# Patient Record
Sex: Male | Born: 1970 | Race: Black or African American | Hispanic: No | Marital: Single | Smoking: Current every day smoker
Health system: Southern US, Community
[De-identification: ages and names within clinical notes are randomized; demographics above are authoritative.]

## PROBLEM LIST (undated history)

## (undated) DIAGNOSIS — M5030 Other cervical disc degeneration, unspecified cervical region: Secondary | ICD-10-CM

## (undated) DIAGNOSIS — M1990 Unspecified osteoarthritis, unspecified site: Secondary | ICD-10-CM

## (undated) HISTORY — DX: Other cervical disc degeneration, unspecified cervical region: M50.30

## (undated) HISTORY — PX: CERVICAL SPINE SURGERY: SHX589

## (undated) HISTORY — DX: Unspecified osteoarthritis, unspecified site: M19.90

---

## 2008-02-25 ENCOUNTER — Emergency Department (HOSPITAL_COMMUNITY): Admission: EM | Admit: 2008-02-25 | Discharge: 2008-02-25 | Payer: Self-pay | Admitting: Emergency Medicine

## 2008-03-03 ENCOUNTER — Ambulatory Visit (HOSPITAL_COMMUNITY): Admission: RE | Admit: 2008-03-03 | Discharge: 2008-03-03 | Payer: Self-pay | Admitting: Neurosurgery

## 2008-03-14 ENCOUNTER — Inpatient Hospital Stay (HOSPITAL_COMMUNITY): Admission: RE | Admit: 2008-03-14 | Discharge: 2008-03-17 | Payer: Self-pay | Admitting: Neurosurgery

## 2008-03-21 ENCOUNTER — Emergency Department (HOSPITAL_COMMUNITY): Admission: EM | Admit: 2008-03-21 | Discharge: 2008-03-21 | Payer: Self-pay | Admitting: Emergency Medicine

## 2008-03-30 ENCOUNTER — Inpatient Hospital Stay (HOSPITAL_COMMUNITY): Admission: RE | Admit: 2008-03-30 | Discharge: 2008-04-01 | Payer: Self-pay | Admitting: Neurosurgery
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
## 2010-06-16 ENCOUNTER — Emergency Department (HOSPITAL_COMMUNITY): Payer: Self-pay

## 2010-06-16 ENCOUNTER — Emergency Department (HOSPITAL_COMMUNITY)
Admission: EM | Admit: 2010-06-16 | Discharge: 2010-06-16 | Disposition: A | Discharge status: Home / Self Care | Payer: Self-pay | Attending: Emergency Medicine | Admitting: Emergency Medicine

## 2010-06-16 DIAGNOSIS — M79609 Pain in unspecified limb: Secondary | ICD-10-CM | POA: Insufficient documentation

## 2010-06-16 DIAGNOSIS — S6390XA Sprain of unspecified part of unspecified wrist and hand, initial encounter: Secondary | ICD-10-CM | POA: Insufficient documentation

## 2010-06-16 DIAGNOSIS — W1789XA Other fall from one level to another, initial encounter: Secondary | ICD-10-CM | POA: Insufficient documentation

## 2010-09-04 NOTE — Op Note (Signed)
NAMEHAMDI, Mark Preston NO.:  [id number]   MEDICAL RECORD NO.:  [id number]          PATIENT TYPE:  INP   LOCATION:  3027                         FACILITY:  [facility]   PHYSICIAN:  Cristi Loron, M.D.DATE OF BIRTH:  [birth year]   DATE OF PROCEDURE:  03/14/2008  DATE OF DISCHARGE:                               OPERATIVE REPORT   BRIEF HISTORY:  The patient is a 40 year old black male who has suffered  from neck and arm pain with numbness and weakness consistent with a  cervical myelopathy.  He was worked up with a cervical CT and a cervical  MRI which demonstrated that the patient has severe stenosis at C3-4 and  C4-5 with more moderate stenosis at C5-6 and C6-7.  I discussed the  various treatment option with the patient and recommended he consider a  four-level anterior cervical diskectomy with fusion plating and  decompress the spinal cord and nerves.  The patient has weighed the  risks, benefits, and alternatives of surgery, and decided to proceed  with that operation.   PREOPERATIVE DIAGNOSES:  C3-4, C4-5, C5-6, C6-7 degenerative disease,  spondylosis stenosis, cervical myelopathy/radiculopathy, cervicalgia.   POSTOPERATIVE DIAGNOSES:  C3-4, C4-5, C5-6, C6-7 degenerative disease,  spondylosis stenosis, cervical myelopathy/radiculopathy, cervicalgia.   PROCEDURE:  C3-4, C4-5, C5-6, C6-7 extensive anterior cervical  diskectomy/decompression; C3-4, C4-5, C5-6, C6-7 anterior interbody  arthrodesis with local morselized autograft bone and Actifuse bone graft  extender; insertion of C3-4, C4-5, C5-6, C6-7 anterior interbody  prosthesis ([place] PEEK interbody prosthesis); anterior cervical  instrumentation with Medtronic transatrial plate C3 to C7.   SURGEON:  Cristi Loron, MD   ASSISTANT:  Stefani Dama, MD   ANESTHESIA:  General endotracheal.   ESTIMATED BLOOD LOSS:  200 mL.   SPECIMENS:  None.   DRAINS:  One prevertebral Jackson-Pratt drain.   COMPLICATIONS:  None.   DESCRIPTION OF PROCEDURE:  The patient was brought to the operating room  by the anesthesia team.  General endotracheal anesthesia was induced.  The patient was carefully induced with midline traction.  The patient  remained in supine position.  A roll was placed under his shoulders  placing the neck.  His neck has slight extension.  His anterior cervical  region was then prepared with Betadine scrub and Betadine solution.  Sterile drapes were applied and then injected the area to be incised  with Marcaine with epinephrine solution.  A scalpel to make a transverse  incision in the patient's left anterior neck.  I used the Metzenbaum  scissors to divide the platysma muscle and then to dissect medial to  sternocleidomastoid muscle, jugular vein, and carotid artery.  We  carefully dissected down towards the anterior cervical spine identifying  esophagus retracting medially.  We then used the Kittner swabs to clear  soft tissue from the anterior cervical spine and then inserted a bent  spinal needle into one of the upper exposed intervertebral disk spaces.  We obtained intraoperative radiograph to confirm our location and we  then used electrocautery to detach the medial border of the longus colli  muscle bilaterally  from C3-4, C4-5, C5-6, and C6-7 intervertebral disk  spaces.  We inserted the Caspar self-retaining retractor underneath the  longus colli muscle bilaterally to provide exposure.  Then, we began  decompression at C3-4.  We incised the C3-4 intervertebral disk with a  15 blade scalpel.  We performed a partial intervertebral diskectomy with  pituitary forceps and the Carlens curettes.  We then inserted  distraction screws at C3 and C4 and distracted the interspace.  We then  used the high-speed drill to decorticate the vertebral endplates at C3-4  through drill away the remainder of C3-4 intervertebral disk to thin out  the posterior longitudinal ligament  and to drill away some posterior  spondylosis.  We incised the thinned out ligament with arachnoid knife  and removed it with Kerrison punch undercutting the vertebral endplates  and decompressing the thecal sac.  We then performed foraminotomies  about the bilateral C4 nerve root, completing the decompression at this  level.   We then repeated this procedure in an analogous fashion at C4-5, C5-6,  and C6-7, decompressing the thecal sac at these levels as well as  bilateral C5, C6, and C7 nerve roots.  This completed the decompression.   We now turned our attention to the arthrodesis.  We used trial spacers  and determined to use a medium PEEK interbody prosthesis to 6-mm at C6-  7, C5-6, and C4-5, and 7-mm at C3-4.  We prefilled these prosthesis.  With a combination of local autograft bone, we obtained during the  decompression as well as Actifuse bone graft extender.  We then inserted  these prostheses and distracted interspaces and then removed distraction  screws.  There was a good snug fit of the prosthesis at each level.  This completed the fusion.   We now turned our attention to the anterior spinal instrumentation.  We  obtained appropriate-length Medtronic translational plate.  We laid it  along the anterior aspect of the vertebral bodies from C3-C7.  We  drilled two 11-mm holes at C3, C4, C5, C6, C7.  We inserted 3-mm screws  into each one of the holes, we could not get to the screws to go down  all the way, so we removed them and put 12-mm screws at two of the  holes.  We then obtained an intraoperative radiograph, which  demonstrated good position of the upper plate screws and interbody  prosthesis.  We then secured the screws and plate by locking the cam.  This completed the instrumentation.   We then obtained hemostasis using bipolar electrocautery.  We irrigated  the wound out with bacitracin solution, removed the retractors.  We then  inspected the esophagus for any  damage; there was none apparent.  We  then placed a 10 mm flat Jackson-Pratt drain in the prevertebral space.  We tunneled out through separate stab wound.  We then reapproximated the  patient's platysma muscle with interrupted 3-0 Vicryl suture,  subcutaneous tissue with interrupted 3-0 Vicryl suture, and the skin  with Steri-Strips and Benzoin.  The wound was then coated with  bacitracin ointment and sterile dressing was applied.  The drapes were  removed and the patient was subsequently extubated by the anesthesia  team and transported to postanesthesia care unit in stable condition  with normal motor strength in his bilateral deltoid, biceps, triceps,  and lower extremities.      Cristi Loron, M.D.  Electronically Signed     JDJ/MEDQ  D:  03/14/2008  T:  03/15/2008  Job:  [id number]

## 2010-09-04 NOTE — Op Note (Signed)
[name], Mark Preston NO.:  [id number]   MEDICAL RECORD NO.:  [id number]          PATIENT TYPE:  INP   LOCATION:  3533                         FACILITY:  [facility]   PHYSICIAN:  Cristi Loron, M.D.DATE OF BIRTH:  1970-10-25   DATE OF PROCEDURE:  03/30/2008  DATE OF DISCHARGE:                               OPERATIVE REPORT   BRIEF HISTORY:  The patient is a 40 year old black male who has suffered  from cervical myelopathy.  I recommended he undergo two-stage procedure,  i.e. C3-4, C4-5, C5-6, and C6-7 anterior cervical diskectomy fusion  plating to be followed by a second stage posterior cervical  instrumentation and fusion.  The patient weighed the risks, benefits,  and alternatives of surgery and decided to proceed with these  operations.   PREOPERATIVE DIAGNOSES:  C3-4, C4-5, C5-6, and C6-7 spondylosis,  stenosis, cervical myelopathy, radiculopathy, cervicalgia.   POSTOPERATIVE DIAGNOSES:  C3-4, C4-5, C5-6, and C6-7 spondylosis,  stenosis, cervical myelopathy, radiculopathy, cervicalgia.   PROCEDURE:  This is a second-stage procedure as follows.  Posterior C3-  4, C4-5, C5-6, and C6-7 arthrodesis with local morselized autograft bone  and bone morphogenic protein and Actifuse bone graft extender; posterior  cervical instrumentation C3 through C7 with Medtronic polyaxial lateral  mass titanium screws and rods.   SURGEON:  Cristi Loron, MD   ASSISTANT:  Payton Doughty, MD   ANESTHESIA:  General endotracheal.   ESTIMATED BLOOD LOSS:  100 mL.   SPECIMENS:  None.   DRAINS:  None.   COMPLICATIONS:  None.   PROCEDURE:  The patient was brought to the operating room by the  anesthesia team.  General endotracheal anesthesia was induced.  The  patient's calvarium was then supported in the [place] three-point  headrest.  The patient was then carefully turned to the prone position  on the chest rolls.  We confirmed its neutral cervical alignment using  fluoroscopy.  The patient's head was supported in the [place] three-  point headrest.  We then used clippers to shave the patient's  suboccipital region.  This region as well as his posterior neck and  upper thorax was then prepared with Betadine scrub and Betadine  solution.  Sterile drapes were applied.  I then injected the area to be  incised with Marcaine with epinephrine solution.  I then used a scalpel  to make a linear midline incision over the interspaces from C3-4 down to  C7-T1.  I used electrocautery to perform a bilateral subperiosteal  dissection exposing the spinous process, lamina lateral masses from C3-  C7 bilaterally.  We used a cerebellar retractor for exposure, and we  confirmed the location/levels with intraoperative fluoroscopy and then  identified the midportion of the lateral masses from C3-C7 bilaterally.  I used the drill and awl to create an entry point at approximately the  middle, upper, and lateral masses.  We aimed in a cephalad and lateral  direction and also under fluoroscopic guidance, drill 14-mm holes from  C3 to C5 bilaterally.  We then inserted 14-mm screws under fluoroscopic  guidance from C3-C5 bilaterally.  At  C6 and C7 bilaterally, we were  unable to visualize the lateral masses under fluoroscopy, but we used  anatomic landmarks to identify the middle of the lateral masses and  gaining cephalad to lateral direction and we placed 40-mm screws  bilaterally at C6 and C7.  I should mention prior inserting the screws,  we probed inside the drill hole with a ball probe and ruled out cortical  breaches at each level.  We had good purchase of screws at each level.   We then cut an appropriate length rod and went to appropriate lordosis  and then connected the lateral screws with the rod, which we tightened  and placed with the caps, this completed the instrumentation.   We then turned out attention to posterolateral arthrodesis.  We used a  high-speed  drill to decorticate the remainder of the lateral masses and  facets from C3 down to C7 bilaterally.  We then laid bone morphogenic  protein-soaked collagen sponges over these decorticated lateral masses  and then placed Actifuse over this.  This completed the posterolateral  arthrodesis.   We then obtained hemostasis using bipolar electrocautery, removed the  retractors and then reapproximated the patient's cervicothoracic fascia  with interrupted 0-Vicryl suture, subcutaneously with interrupted 2-0  Vicryl suture, and skin with Steri-Strips and Benzoin.  The wound was  then coated with bacitracin ointment, sterile dressing was applied, the  drapes were removed, and the patient was subsequently returned to the  supine position and [place] three-point headrest was removed from its  calvarium and the patient was subsequently extubated by the anesthesia  team and transported to the post anesthesia care unit in stable  condition.  All sponge, instrument, and needle counts were correct at  the end of this case.      Cristi Loron, M.D.  Electronically Signed     JDJ/MEDQ  D:  03/31/2008  T:  03/31/2008  Job:  [id number]

## 2010-09-07 NOTE — Discharge Summary (Signed)
Mark Preston, Mark Preston NO.:  [id number]   MEDICAL RECORD NO.:  [id number]          PATIENT TYPE:  INP   LOCATION:  3027                         FACILITY:  [facility]   PHYSICIAN:  Cristi Loron, M.D.DATE OF BIRTH:  01/02/1971   DATE OF ADMISSION:  03/14/2008  DATE OF DISCHARGE:  03/17/2008                               DISCHARGE SUMMARY   BRIEF HISTORY:  The patient is a 40 year old black male who suffers from  a cervical myelopathy.  He was worked up with cervical CT and cervical  MRI which demonstrated the patient has severe stenosis at C3-C4 and C4-  C5 with more moderate stenosis at C5-C6 and C6-C7.  I discussed the  various treatment options with the patient and recommended he undergo a  four-level anterior cervical diskectomy with fusion plating.  The  patient has weighed the risks, benefits, and alternatives of surgery,  and decided to proceed with that operation.   For further details of this admission, please refer to the patient's  admission history and physical.   HOSPITAL COURSE:  Admitted the patient to [facility] on  March 14, 2008.  On the day of admission, I performed a C3-C4, C4-C5,  C5-C6, and C6-C7 anterior cervical diskectomy with fusion plating.  The  surgery went well (for full details of this operation, please refer to  operative note).   POSTOPERATIVE COURSE:  The patient's postoperative course was  unremarkable and by postoperative day #3, the patient was afebrile.  His  vital signs were stable.  His dysphagia had improved and he was  requesting a discharge to home.   DISCHARGE INSTRUCTIONS:  The patient was given main discharge  instructions and instructions to follow up with me in 4 weeks.   DISCHARGE PRESCRIPTIONS:  1. Percocet 10/325 #100 one p.o. q.4 h. p.r.n. pain.  2. Valium 5 mg #15 one p.o. q.6 h. p.r.n. for muscle spasm.   FINAL DIAGNOSES:  C3-C4, C4-C5, C5-C6, and C6-C7 spondylosis, stenosis,  degenerative  disk disease, cervical myelopathy, cervical radiculopathy,  and cervicalgia.   PROCEDURE PERFORMED:  C3-C4, C4-C5, C5-C6, and C6-C7 extensive anterior  cervical diskectomy/decompression; C3-C4, C4-C5, C5-C6, and C6-C7  anterior interbody arthrodesis with local morselized autograft bone and  Actifuse bone graft extender; insertion of C3-C4, C4-C5, C5-C6, and C6-  C7 anterior interbody prosthesis ([place]eville PEEK interbody prosthesis);  anterior cervical instrumentation and fusion with Medtronic  translational plate C3 to C7.      Cristi Loron, M.D.  Electronically Signed    JDJ/MEDQ  D:  05/05/2008  T:  05/06/2008  Job:  [id number]

## 2011-01-23 LAB — CBC
Platelets: ADEQUATE
RBC: 4.6

## 2011-01-25 LAB — CBC
HCT: 42.2 % (ref 39.0–52.0)
Hemoglobin: 14.7 g/dL (ref 13.0–17.0)
MCHC: 34.8 g/dL (ref 30.0–36.0)
RBC: 4.19 MIL/uL — ABNORMAL LOW (ref 4.22–5.81)
RDW: 12.4 % (ref 11.5–15.5)
WBC: 10 10*3/uL (ref 4.0–10.5)

## 2013-12-10 ENCOUNTER — Encounter (HOSPITAL_COMMUNITY): Payer: Self-pay | Admitting: Emergency Medicine

## 2013-12-10 ENCOUNTER — Emergency Department (HOSPITAL_COMMUNITY)
Admission: EM | Admit: 2013-12-10 | Discharge: 2013-12-10 | Disposition: A | Discharge status: Home / Self Care | Payer: Self-pay | Attending: Emergency Medicine | Admitting: Emergency Medicine

## 2013-12-10 DIAGNOSIS — H53149 Visual discomfort, unspecified: Secondary | ICD-10-CM | POA: Insufficient documentation

## 2013-12-10 DIAGNOSIS — H5710 Ocular pain, unspecified eye: Secondary | ICD-10-CM | POA: Insufficient documentation

## 2013-12-10 DIAGNOSIS — R110 Nausea: Secondary | ICD-10-CM | POA: Insufficient documentation

## 2013-12-10 DIAGNOSIS — H5789 Other specified disorders of eye and adnexa: Secondary | ICD-10-CM | POA: Insufficient documentation

## 2013-12-10 DIAGNOSIS — H579 Unspecified disorder of eye and adnexa: Secondary | ICD-10-CM | POA: Insufficient documentation

## 2013-12-10 DIAGNOSIS — F17200 Nicotine dependence, unspecified, uncomplicated: Secondary | ICD-10-CM | POA: Insufficient documentation

## 2013-12-10 MED ORDER — TETRACAINE HCL 0.5 % OP SOLN
2.0000 [drp] | Freq: Once | OPHTHALMIC | Status: AC
  Administered 2013-12-10: 2 [drp] via OPHTHALMIC
  Filled 2013-12-10: qty 2

## 2013-12-10 MED ORDER — FLUORESCEIN SODIUM 1 MG OP STRP
1.0000 | ORAL_STRIP | Freq: Once | OPHTHALMIC | Status: AC
  Administered 2013-12-10: 1 via OPHTHALMIC
  Filled 2013-12-10: qty 1

## 2013-12-10 MED ORDER — GENTAMICIN SULFATE 0.3 % OP SOLN: 2.0000 [drp] | OPHTHALMIC | Status: DC

## 2013-12-10 MED ORDER — KETOROLAC TROMETHAMINE 0.4 % OP SOLN: 1.0000 [drp] | Freq: Four times a day (QID) | OPHTHALMIC | Status: DC

## 2013-12-10 NOTE — Care Management Note (Signed)
ED/CM noted patient did not have health insurance and/or PCP listed in the computer.  [facility] was given the [facility] resource handout with information on the clinics, food pantries, and the handout for new health insurance sign-up.  Patient expressed appreciation for information received. Pt was given a Rx discount card also.

## 2013-12-10 NOTE — ED Notes (Signed)
Fluorescein strips, tetracaine, and woods lamp set up at bedside, PA performing exam.

## 2013-12-10 NOTE — ED Provider Notes (Signed)
CSN: [id number]     Arrival date & time 12/10/13  1002 History   First MD Initiated Contact with Patient 12/10/13 1021     Chief Complaint  Patient presents with  . Eye Pain     (Consider location/radiation/quality/duration/timing/severity/associated sxs/prior Treatment) HPI Comments: Patient is a 43 year old male who presents to the emergency department today with right eye pain. He reports that on Monday he was walking up a ladder with a bag of bricks. The bag broke and he rubbed his eye On Wednesday he woke up with eye pain and redness. He has watery discharge coming from his eye. He has been using Visine without relief of his symptoms. He has photophobia. He has some associated nausea. He describes that pain as throbbing. No blurry vision, double vision. He has no history of prior eye surgery. No history of glaucoma.  Patient is a 43 y.o. male presenting with eye pain. The history is provided by the patient. No language interpreter was used.  Eye Pain Associated symptoms include nausea. Pertinent negatives include no chest pain, chills, fever or vomiting.    History reviewed. No pertinent past medical history. Past Surgical History  Procedure Laterality Date  . Cervical spine surgery     No family history on file. History  Substance Use Topics  . Smoking status: Current Every Day Smoker    Types: Cigarettes  . Smokeless tobacco: Not on file  . Alcohol Use: Yes     Comment: occ    Review of Systems  Constitutional: Negative for fever and chills.  Eyes: Positive for photophobia, pain, discharge and redness. Negative for itching and visual disturbance.  Respiratory: Negative for shortness of breath.   Cardiovascular: Negative for chest pain.  Gastrointestinal: Positive for nausea. Negative for vomiting.  All other systems reviewed and are negative.     Allergies  Review of patient's allergies indicates no known allergies.  Home Medications   Prior to Admission  medications   Not on File   BP 148/87  Pulse 94  Temp(Src) 98.1 F (36.7 C) (Oral)  Resp 16  Ht 5\' 10"  (1.778 m)  Wt 205 lb (92.987 kg)  BMI 29.41 kg/m2  SpO2 97% Physical Exam  Nursing note and vitals reviewed. Constitutional: He is oriented to person, place, and time. He appears well-developed and well-nourished. No distress.  HENT:  Head: Normocephalic and atraumatic.  Right Ear: External ear normal.  Left Ear: External ear normal.  Nose: Nose normal.  Eyes: EOM and lids are normal. Pupils are equal, round, and reactive to light. Lids are everted and swept, no foreign bodies found. Right eye exhibits no chemosis, no discharge, no exudate and no hordeolum. No foreign body present in the right eye. Right conjunctiva is injected. Right eye exhibits normal extraocular motion and no nystagmus.  Slit lamp exam:      The right eye shows no corneal abrasion, no corneal flare, no corneal ulcer, no foreign body, no hyphema, no hypopyon and no fluorescein uptake.  IOP of right eye is 23 Right conjunctiva is red and irritated. No fluorescein dye uptake.  No consensual photophobia.   Neck: Normal range of motion. No tracheal deviation present.  Cardiovascular: Normal rate, regular rhythm and normal heart sounds.   Pulmonary/Chest: Effort normal and breath sounds normal. No stridor.  Abdominal: Soft. He exhibits no distension. There is no tenderness.  Musculoskeletal: Normal range of motion.  Neurological: He is alert and oriented to person, place, and time.  Skin: Skin is  warm and dry. He is not diaphoretic.  Psychiatric: He has a normal mood and affect. His behavior is normal.    ED Course  Procedures (including critical care time) Labs Review Labs Reviewed - No data to display  Imaging Review No results found.   EKG Interpretation None      MDM   Final diagnoses:  Eye irritation    Patient presents to ED with right eye irritation. No visual disturbance. IOP 23. No  corneal abrasion or fluorescein dye uptake. Discussed this case with Dr. Lita MainsHaines who recommends abx drops and Ketoralac drops. This is likely not an alkaline burn given time frame and materials used. He will see patient in the office on Monday. Discussed with patient reason to return to ED immediately. Vital signs stable for discharge. Dr. Estell HarpinZammit evaluated patient and agrees with plan. Patient / Family / Caregiver informed of clinical course, understand medical decision-making process, and agree with plan.  Mora BellmanHannah S Wyona Neils, PA-C 12/10/13 [phone]

## 2013-12-10 NOTE — ED Notes (Signed)
Redness, watery right eye x 2 days.  Reports light sensitivity.  States feels like he small particle rocks fell in eye while at work.

## 2013-12-10 NOTE — ED Notes (Signed)
Tono pen given to PA for examination.

## 2013-12-10 NOTE — Discharge Instructions (Signed)
Please use the prescriptions you are receiving today. Follow up with Dr. Lita MainsHaines on Monday. Return to the Emergency Room for new or worsening symptoms. We did not find any scratches on your eye.

## 2013-12-13 NOTE — ED Provider Notes (Signed)
Medical screening examination/treatment/procedure(s) were performed by non-physician practitioner and as supervising physician I was immediately available for consultation/collaboration.   EKG Interpretation None        Yovanni Frenette L Izekiel Flegel, MD 12/13/13 1701 

## 2014-10-18 ENCOUNTER — Emergency Department (HOSPITAL_COMMUNITY)
Admission: EM | Admit: 2014-10-18 | Discharge: 2014-10-18 | Disposition: A | Discharge status: Home / Self Care | Payer: Self-pay | Attending: Emergency Medicine | Admitting: Emergency Medicine

## 2014-10-18 ENCOUNTER — Encounter (HOSPITAL_COMMUNITY): Payer: Self-pay | Admitting: *Deleted

## 2014-10-18 DIAGNOSIS — Z791 Long term (current) use of non-steroidal anti-inflammatories (NSAID): Secondary | ICD-10-CM | POA: Insufficient documentation

## 2014-10-18 DIAGNOSIS — Z4802 Encounter for removal of sutures: Secondary | ICD-10-CM | POA: Insufficient documentation

## 2014-10-18 DIAGNOSIS — Z720 Tobacco use: Secondary | ICD-10-CM | POA: Insufficient documentation

## 2014-10-18 MED ORDER — BACITRACIN-NEOMYCIN-POLYMYXIN 400-5-5000 EX OINT
TOPICAL_OINTMENT | Freq: Once | CUTANEOUS | Status: AC
  Administered 2014-10-18: 1 via TOPICAL
  Filled 2014-10-18: qty 1

## 2014-10-18 NOTE — ED Notes (Signed)
Pt here for suture removal

## 2014-10-18 NOTE — ED Provider Notes (Signed)
CSN: [id number]     Arrival date & time 10/18/14  1956 History   First MD Initiated Contact with Patient 10/18/14 2013     Chief Complaint  Patient presents with  . Suture / Staple Removal     (Consider location/radiation/quality/duration/timing/severity/associated sxs/prior Treatment) HPI Mark Preston is a 44 y.o. male who presents to the ED for suture removal. He denies any problems.  History reviewed. No pertinent past medical history. Past Surgical History  Procedure Laterality Date  . Cervical spine surgery     History reviewed. No pertinent family history. History  Substance Use Topics  . Smoking status: Current Every Day Smoker    Types: Cigarettes  . Smokeless tobacco: Not on file  . Alcohol Use: Yes     Comment: occ    Review of Systems Negative except as stated in HPI   Allergies  Review of patient's allergies indicates no known allergies.  Home Medications   Prior to Admission medications   Medication Sig Start Date End Date Taking? Authorizing Provider  gentamicin (GARAMYCIN) 0.3 % ophthalmic solution Place 2 drops into the left eye every 4 (four) hours. 12/10/13   Junious SilkHannah Merrell, PA-C  ketorolac (ACULAR) 0.4 % SOLN Place 1 drop into the left eye 4 (four) times daily. 12/10/13   Junious SilkHannah Merrell, PA-C   BP 127/80 mmHg  Pulse 79  Temp(Src) 98.2 F (36.8 C) (Oral)  Resp 15  Ht 5\' 10"  (1.778 m)  Wt 210 lb (95.255 kg)  BMI 30.13 kg/m2  SpO2 99% Physical Exam  Constitutional: He is oriented to person, place, and time. He appears well-developed and well-nourished.  HENT:  Head: Normocephalic.  Eyes: EOM are normal.  Neck: Neck supple.  Cardiovascular: Normal rate.   Pulmonary/Chest: Effort normal.  Musculoskeletal: Normal range of motion.  Sutures in place dorsum of the right hand without signs of infection.   Neurological: He is alert and oriented to person, place, and time. No cranial nerve deficit.  Skin: Skin is warm and dry.  Psychiatric: He has  a normal mood and affect. His behavior is normal.  Nursing note and vitals reviewed.   ED Course  Procedures (including critical care time) Labs Review Sutures removed by RN without difficulty Bacitracin ointment and dressing   MDM  44 y.o. male here for suture removal without problems. Discussed care of wound and all questions answered. He will return as needed for any problems.   Final diagnoses:  Visit for suture removal       Janne NapoleonHope M Mosie Angus, NP 10/18/14 2022  Zadie Rhineonald Wickline, MD 10/19/14 [phone]

## 2014-10-18 NOTE — Discharge Instructions (Signed)

## 2017-05-23 ENCOUNTER — Ambulatory Visit: Payer: Self-pay | Admitting: Family Medicine

## 2022-11-01 ENCOUNTER — Telehealth: Payer: Self-pay

## 2022-11-01 NOTE — Telephone Encounter (Signed)
Returned call to [facility] client who is transitioning to [place]. He states he does not have his card yet and wants to know if he needs to give [place] that number. Explained to him that we do not need his ID number, [facility] and explained that if he chooses he can remain there and recommended to him to call them and let them know that he been approved for Medicaid and they should be able to check his status. He states understanding.    Discussed to please call us again if he has any other questions.    Francee Nodal RN [employer]

## 2022-11-29 ENCOUNTER — Telehealth: Payer: Self-pay

## 2022-11-29 NOTE — Telephone Encounter (Signed)
Attempted follow up/Wellness call to [facility] client recently transitioning to Medicaid. He is with [facility] and next appointment is 01/20/23.  No answer today, left message.  Francee Nodal RN [employer]

## 2022-12-30 DIAGNOSIS — B029 Zoster without complications: Secondary | ICD-10-CM | POA: Diagnosis not present

## 2023-01-01 ENCOUNTER — Encounter: Payer: Self-pay | Admitting: *Deleted

## 2023-01-08 ENCOUNTER — Encounter: Payer: Self-pay | Admitting: Orthopedic Surgery

## 2023-01-08 ENCOUNTER — Other Ambulatory Visit (INDEPENDENT_AMBULATORY_CARE_PROVIDER_SITE_OTHER): Payer: 59

## 2023-01-08 ENCOUNTER — Ambulatory Visit: Payer: 59 | Admitting: Orthopedic Surgery

## 2023-01-08 VITALS — BP 130/79 | HR 73 | Ht 70.0 in | Wt 234.0 lb

## 2023-01-08 DIAGNOSIS — G8929 Other chronic pain: Secondary | ICD-10-CM | POA: Diagnosis not present

## 2023-01-08 DIAGNOSIS — M25562 Pain in left knee: Secondary | ICD-10-CM | POA: Diagnosis not present

## 2023-01-08 MED ORDER — METHYLPREDNISOLONE ACETATE 40 MG/ML IJ SUSP
40.0000 mg | Freq: Once | INTRAMUSCULAR | Status: AC
Start: 2023-01-08 — End: 2023-01-08
  Administered 2023-01-08: 40 mg via INTRA_ARTICULAR

## 2023-01-08 NOTE — Progress Notes (Signed)
New Patient Visit  Assessment: Mark Preston is a 52 y.o. male with the following: 1. Chronic pain of left knee  Plan: MARKIS HOWERY has pain in his left knee.  This has been ongoing for several years.  No specific injury.  He notes periodic swelling.  Pain gets worse the more he is on his feet.  He has pain in the medial knee, as well as the posterior knee.  He has recently started taking meloxicam, which helps with the swelling.  No physical therapy.  I am recommending a steroid injection, and he elected to proceed.  He can return for repeat evaluation, or consideration for an injection in his right knee.  Follow-up as needed.   Procedure note injection Left knee joint   Verbal consent was obtained to inject the left knee joint  Timeout was completed to confirm the site of injection.  The skin was prepped with alcohol and ethyl chloride was sprayed at the injection site.  A 21-gauge needle was used to inject 40 mg of Depo-Medrol and 1% lidocaine (4 cc) into the left knee using an anterolateral approach.  There were no complications. A sterile bandage was applied.     Follow-up: Return if symptoms worsen or fail to improve.  Subjective:  Chief Complaint  Patient presents with   Knee Pain    Bilat knee pain L > R for past 2 yrs getting worse    History of Present Illness: Mark Preston is a 52 y.o. male who has been referred by Coral Ceo, FNP for evaluation of left knee pain.  He is complaining of pain in both knees, but left is worse than right.  This has been ongoing for couple of years.  No specific injury.  He notes periodic swelling.  He has recently started taking meloxicam, which is helping with his symptoms, especially swelling.  No prior injuries.  He has not had an injection.  Has not worked with physical therapy.  He is localizing the pain to the medial aspect of the left knee, as well as the posterior left knee.   Review of Systems: No fevers or chills No  numbness or tingling No chest pain No shortness of breath No bowel or bladder dysfunction No GI distress No headaches   Medical History:  History reviewed. No pertinent past medical history.  Past Surgical History:  Procedure Laterality Date   CERVICAL SPINE SURGERY      History reviewed. No pertinent family history. Social History   Tobacco Use   Smoking status: Every Day    Types: Cigarettes  Substance Use Topics   Alcohol use: Yes    Comment: occ   Drug use: No    No Known Allergies  Current Meds  Medication Sig   meloxicam (MOBIC) 7.5 MG tablet Take 7.5 mg by mouth daily.    Objective: BP 130/79   Pulse 73   Ht 5\' 10"  (1.778 m)   Wt 234 lb (106.1 kg)   BMI 33.58 kg/m   Physical Exam:  General: Alert and oriented. and No acute distress. Gait: Left sided antalgic gait.  Left knee without swelling.  No deformity.  Tenderness to palpation along the medial joint line.  No increased laxity varus or valgus stress.  Negative Lachman.  Tenderness to palpation within the popliteal fossa.  Right knee without swelling.  No deformity.  Minimal tenderness to palpation.  No tenderness within the posterior knee.  Negative Lachman.  No increased laxity to  varus or valgus stress.  IMAGING: I personally ordered and reviewed the following images  X-rays of the left knee were obtained in clinic today.  No acute injuries are noted.  Neutral overall alignment.  Mild loss of joint space within the medial compartment.  Well-maintained joint space throughout the knee.  Small osteophytes are appreciated within the patellofemoral compartment.  No bony lesions.  Impression: Left knee x-rays without acute injury, mild degenerative changes   New Medications:  Meds ordered this encounter  Medications   methylPREDNISolone acetate (DEPO-MEDROL) injection 40 mg      Oliver Barre, MD  01/08/2023 3:09 PM

## 2023-01-08 NOTE — Patient Instructions (Signed)
Instructions Following Joint Injections  In clinic today, you received an injection in one of your joints (sometimes more than one).  Occasionally, you can have some pain at the injection site, this is normal.  You can place ice at the injection site, or take over-the-counter medications such as Tylenol (acetaminophen) or Advil (ibuprofen).  Please follow all directions listed on the bottle.  If your joint (knee or shoulder) becomes swollen, red or very painful, please contact the clinic for additional assistance.   Two medications were injected, including lidocaine and a steroid (often referred to as cortisone).  Lidocaine is effective almost immediately but wears off quickly.  However, the steroid can take a few days to improve your symptoms.  In some cases, it can make your pain worse for a couple of days.  Do not be concerned if this happens as it is common.  You can apply ice or take some over-the-counter medications as needed.   Injections in the same joint cannot be repeated for 3 months.  This helps to limit the risk of an infection in the joint.  If you were to develop an infection in your joint, the best treatment option would be surgery.      Knee Exercises  Ask your health care provider which exercises are safe for you. Do exercises exactly as told by your health care provider and adjust them as directed. It is normal to feel mild stretching, pulling, tightness, or discomfort as you do these exercises. Stop right away if you feel sudden pain or your pain gets worse. Do not begin these exercises until told by your health care provider.  Stretching and range-of-motion exercises These exercises warm up your muscles and joints and improve the movement and flexibility of your knee. These exercises also help to relieve pain and swelling.  Knee extension, prone Lie on your abdomen (prone position) on a bed. Place your left / right knee just beyond the edge of the surface so your knee is  not on the bed. You can put a towel under your left / right thigh just above your kneecap for comfort. Relax your leg muscles and allow gravity to straighten your knee (extension). You should feel a stretch behind your left / right knee. Hold this position for 10 seconds. Scoot up so your knee is supported between repetitions. Repeat 10 times. Complete this exercise 3-4 times per week.     Knee flexion, active Lie on your back with both legs straight. If this causes back discomfort, bend your left / right knee so your foot is flat on the floor. Slowly slide your left / right heel back toward your buttocks. Stop when you feel a gentle stretch in the front of your knee or thigh (flexion). Hold this position for 10 seconds. Slowly slide your left / right heel back to the starting position. Repeat 10 times. Complete this exercise 3-4 times per week.      Quadriceps stretch, prone Lie on your abdomen on a firm surface, such as a bed or padded floor. Bend your left / right knee and hold your ankle. If you cannot reach your ankle or pant leg, loop a belt around your foot and grab the belt instead. Gently pull your heel toward your buttocks. Your knee should not slide out to the side. You should feel a stretch in the front of your thigh and knee (quadriceps). Hold this position for 10 seconds. Repeat 10 times. Complete this exercise 3-4 times per week.  Hamstring, supine Lie on your back (supine position). Loop a belt or towel over the ball of your left / right foot. The ball of your foot is on the walking surface, right under your toes. Straighten your left / right knee and slowly pull on the belt to raise your leg until you feel a gentle stretch behind your knee (hamstring). Do not let your knee bend while you do this. Keep your other leg flat on the floor. Hold this position for 10 seconds. Repeat 10 times. Complete this exercise 3-4 times per week.   Strengthening exercises These  exercises build strength and endurance in your knee. Endurance is the ability to use your muscles for a long time, even after they get tired.  Quadriceps, isometric This exercise stretches the muscles in front of your thigh (quadriceps) without moving your knee joint (isometric). Lie on your back with your left / right leg extended and your other knee bent. Put a rolled towel or small pillow under your knee if told by your health care provider. Slowly tense the muscles in the front of your left / right thigh. You should see your kneecap slide up toward your hip or see increased dimpling just above the knee. This motion will push the back of the knee toward the floor. For 10 seconds, hold the muscle as tight as you can without increasing your pain. Relax the muscles slowly and completely. Repeat 10 times. Complete this exercise 3-4 times per week. .     Straight leg raises This exercise stretches the muscles in front of your thigh (quadriceps) and the muscles that move your hips (hip flexors). Lie on your back with your left / right leg extended and your other knee bent. Tense the muscles in the front of your left / right thigh. You should see your kneecap slide up or see increased dimpling just above the knee. Your thigh may even shake a bit. Keep these muscles tight as you raise your leg 4-6 inches (10-15 cm) off the floor. Do not let your knee bend. Hold this position for 10 seconds. Keep these muscles tense as you lower your leg. Relax your muscles slowly and completely after each repetition. Repeat 10 times. Complete this exercise 3-4 times per week.  Hamstring, isometric Lie on your back on a firm surface. Bend your left / right knee about 30 degrees. Dig your left / right heel into the surface as if you are trying to pull it toward your buttocks. Tighten the muscles in the back of your thighs (hamstring) to "dig" as hard as you can without increasing any pain. Hold this position for 10  seconds. Release the tension gradually and allow your muscles to relax completely for __________ seconds after each repetition. Repeat 10 times. Complete this exercise 3-4 times per week.  Hamstring curls If told by your health care provider, do this exercise while wearing ankle weights. Begin with 5 lb weights. Then increase the weight by 1 lb (0.5 kg) increments. You can also use an exercise band Lie on your abdomen with your legs straight. Bend your left / right knee as far as you can without feeling pain. Keep your hips flat against the floor. Hold this position for 10 seconds. Slowly lower your leg to the starting position. Repeat 10 times. Complete this exercise 3-4 times per week.      Squats This exercise strengthens the muscles in front of your thigh and knee (quadriceps). Stand in front of a table,  with your feet and knees pointing straight ahead. You may rest your hands on the table for balance but not for support. Slowly bend your knees and lower your hips like you are going to sit in a chair. Keep your weight over your heels, not over your toes. Keep your lower legs upright so they are parallel with the table legs. Do not let your hips go lower than your knees. Do not bend lower than told by your health care provider. If your knee pain increases, do not bend as low. Hold the squat position for 10 seconds. Slowly push with your legs to return to standing. Do not use your hands to pull yourself to standing. Repeat 10 times. Complete this exercise 3-4 times per week .     Wall slides This exercise strengthens the muscles in front of your thigh and knee (quadriceps). Lean your back against a smooth wall or door, and walk your feet out 18-24 inches (46-61 cm) from it. Place your feet hip-width apart. Slowly slide down the wall or door until your knees bend 90 degrees. Keep your knees over your heels, not over your toes. Keep your knees in line with your hips. Hold this  position for 10 seconds. Repeat 10 times. Complete this exercise 3-4 times per week.      Straight leg raises This exercise strengthens the muscles that rotate the leg at the hip and move it away from your body (hip abductors). Lie on your side with your left / right leg in the top position. Lie so your head, shoulder, knee, and hip line up. You may bend your bottom knee to help you keep your balance. Roll your hips slightly forward so your hips are stacked directly over each other and your left / right knee is facing forward. Leading with your heel, lift your top leg 4-6 inches (10-15 cm). You should feel the muscles in your outer hip lifting. Do not let your foot drift forward. Do not let your knee roll toward the ceiling. Hold this position for 10 seconds. Slowly return your leg to the starting position. Let your muscles relax completely after each repetition. Repeat 10 times. Complete this exercise 3-4 times per week.      Straight leg raises This exercise stretches the muscles that move your hips away from the front of the pelvis (hip extensors). Lie on your abdomen on a firm surface. You can put a pillow under your hips if that is more comfortable. Tense the muscles in your buttocks and lift your left / right leg about 4-6 inches (10-15 cm). Keep your knee straight as you lift your leg. Hold this position for 10 seconds. Slowly lower your leg to the starting position. Let your leg relax completely after each repetition. Repeat 10 times. Complete this exercise 3-4 times per week.

## 2023-04-21 NOTE — H&P (View-Only) (Signed)
Referring Provider: Chancy Milroy, MD  Primary Care Physician:  Wilmon Pali, FNP Primary Gastroenterologist:  Dr. Jena Gauss  Chief Complaint  Patient presents with   Positive fit test     Positive fit test. Has seen blood in stool before.     HPI:   Mark Preston is a 52 y.o. male presenting today at the request of Chancy Milroy, MD for positive FIT completed 04/01/23.   Today:  Prolapsing hemorrhoids a couple times a year for the last 10 years or so. Will have blood in the stool a couple times with this. Will use preparation H and reduce hemorrhoids and symptoms resolve. Last flare was a couple months ago.   No constipation, diarrhea, abdominal pain, unintentional weight loss. Reflux if eating spaghetti or fried hamburger, but nothing routine. No dysphagia, nausea, or vomiting.   Prior colonoscopy: No.  HFx colon cancer: Cousin.   Has arthritis and several disc surgeries on his neck.  Has been on meloxicam for about 2 months, taking twice a day.  Past Medical History:  Diagnosis Date   Arthritis    Degenerative disc disease, cervical     Past Surgical History:  Procedure Laterality Date   CERVICAL SPINE SURGERY      Current Outpatient Medications  Medication Sig Dispense Refill   gabapentin (NEURONTIN) 300 MG capsule Take 300 mg by mouth 3 (three) times daily.     meloxicam (MOBIC) 7.5 MG tablet Take 7.5 mg by mouth 2 (two) times daily.     No current facility-administered medications for this visit.    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 CV: Denies chest pain, heart palpitations.  Resp: Denies shortness of breath, cough. GI: See HPI GU : Denies urinary burning, urinary frequency, urinary hesitancy MS: Admits to chronic left knee pain. Derm: Denies rash. Psych: Denies depression, anxiety. Heme: See HPI  Physical Exam: BP 122/80 (BP Location: Right Arm, Patient Position: Sitting, Cuff Size: Normal)   Pulse 81   Temp 98.2 F (36.8 C) (Temporal)   Ht 5\' 10"  (1.778 m)   Wt 232 lb (105.2 kg)   BMI 33.29 kg/m  General:   Alert and oriented. Pleasant and cooperative. Well-nourished and well-developed.  Head:  Normocephalic and atraumatic. Eyes:  Without icterus, sclera clear and conjunctiva pink.  Ears:  Normal auditory acuity. Lungs:  Clear to auscultation bilaterally. No wheezes, rales, or rhonchi. No distress.  Heart:  S1, S2 present without murmurs appreciated.  Abdomen:  +BS, soft, non-tender and non-distended. No HSM noted. No guarding or rebound. No masses appreciated.  Rectal:  Deferred  Msk:  Symmetrical without gross deformities. Normal posture. Extremities:  Without edema. Neurologic:  Alert and  oriented x4;  grossly normal neurologically. Skin:  Intact without significant lesions or rashes. Psych:  Normal mood and affect.    Assessment:  52 year old male with history of arthritis,  degenerative disc disease, presenting today at the request of Chancy Milroy, MD for positive FIT completed 04/01/23.  Patient reports to chronic history of intermittent low-volume rectal bleeding in the setting of prolapsing hemorrhoids that occurs a couple times a year with last occurrence about 2 months ago. Hemorrhoids respond well to preparation H. Otherwise, no significant GI symptoms.  No prior colonoscopy.  Cousin with history of colon cancer.  Etiology of positive FIT is not clear, but very well may be related to hemorrhoids.  However, we will proceed with a colonoscopy to rule out colon polyps or malignancy.  Daily NSAID use: Currently taking meloxicam twice daily for arthritis.  Recommended discussing daily acid suppression with PCP to prevent gastric ulceration if meloxicam is going to be a chronic, daily medication for him.   Plan:  Proceed with colonoscopy with propofol by Dr. Jena Gauss in near future. The risks, benefits, and alternatives have been discussed with the patient in detail. The patient states understanding and desires to proceed.  ASA 2 Recommended discussing daily acid suppression with PCP to prevent gastric ulceration if meloxicam is going to be a chronic, daily medication for him. Follow-up PRN.    Ermalinda Memos, PA-C [facility] 04/24/2023

## 2023-04-21 NOTE — Progress Notes (Signed)
 Referring Provider: Chancy Milroy, MD  Primary Care Physician:  Wilmon Pali, FNP Primary Gastroenterologist:  Dr. Jena Gauss  Chief Complaint  Patient presents with   Positive fit test     Positive fit test. Has seen blood in stool before.     HPI:   Mark Preston is a 52 y.o. male presenting today at the request of Chancy Milroy, MD for positive FIT completed 04/01/23.   Today:  Prolapsing hemorrhoids a couple times a year for the last 10 years or so. Will have blood in the stool a couple times with this. Will use preparation H and reduce hemorrhoids and symptoms resolve. Last flare was a couple months ago.   No constipation, diarrhea, abdominal pain, unintentional weight loss. Reflux if eating spaghetti or fried hamburger, but nothing routine. No dysphagia, nausea, or vomiting.   Prior colonoscopy: No.  HFx colon cancer: Cousin.   Has arthritis and several disc surgeries on his neck.  Has been on meloxicam for about 2 months, taking twice a day.  Past Medical History:  Diagnosis Date   Arthritis    Degenerative disc disease, cervical     Past Surgical History:  Procedure Laterality Date   CERVICAL SPINE SURGERY      Current Outpatient Medications  Medication Sig Dispense Refill   gabapentin (NEURONTIN) 300 MG capsule Take 300 mg by mouth 3 (three) times daily.     meloxicam (MOBIC) 7.5 MG tablet Take 7.5 mg by mouth 2 (two) times daily.     No current facility-administered medications for this visit.    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 CV: Denies chest pain, heart palpitations.  Resp: Denies shortness of breath, cough. GI: See HPI GU : Denies urinary burning, urinary frequency, urinary hesitancy MS: Admits to chronic left knee pain. Derm: Denies rash. Psych: Denies depression, anxiety. Heme: See HPI  Physical Exam: BP 122/80 (BP Location: Right Arm, Patient Position: Sitting, Cuff Size: Normal)   Pulse 81   Temp 98.2 F (36.8 C) (Temporal)   Ht 5\' 10"  (1.778 m)   Wt 232 lb (105.2 kg)   BMI 33.29 kg/m  General:   Alert and oriented. Pleasant and cooperative. Well-nourished and well-developed.  Head:  Normocephalic and atraumatic. Eyes:  Without icterus, sclera clear and conjunctiva pink.  Ears:  Normal auditory acuity. Lungs:  Clear to auscultation bilaterally. No wheezes, rales, or rhonchi. No distress.  Heart:  S1, S2 present without murmurs appreciated.  Abdomen:  +BS, soft, non-tender and non-distended. No HSM noted. No guarding or rebound. No masses appreciated.  Rectal:  Deferred  Msk:  Symmetrical without gross deformities. Normal posture. Extremities:  Without edema. Neurologic:  Alert and  oriented x4;  grossly normal neurologically. Skin:  Intact without significant lesions or rashes. Psych:  Normal mood and affect.    Assessment:  52 year old male with history of arthritis,  degenerative disc disease, presenting today at the request of Chancy Milroy, MD for positive FIT completed 04/01/23.  Patient reports to chronic history of intermittent low-volume rectal bleeding in the setting of prolapsing hemorrhoids that occurs a couple times a year with last occurrence about 2 months ago. Hemorrhoids respond well to preparation H. Otherwise, no significant GI symptoms.  No prior colonoscopy.  Cousin with history of colon cancer.  Etiology of positive FIT is not clear, but very well may be related to hemorrhoids.  However, we will proceed with a colonoscopy to rule out colon polyps or malignancy.  Daily NSAID use: Currently taking meloxicam twice daily for arthritis.  Recommended discussing daily acid suppression with PCP to prevent gastric ulceration if meloxicam is going to be a chronic, daily medication for him.   Plan:  Proceed with colonoscopy with propofol by Dr. Jena Gauss in near future. The risks, benefits, and alternatives have been discussed with the patient in detail. The patient states understanding and desires to proceed.  ASA 2 Recommended discussing daily acid suppression with PCP to prevent gastric ulceration if meloxicam is going to be a chronic, daily medication for him. Follow-up PRN.    Ermalinda Memos, PA-C [facility] 04/24/2023

## 2023-04-24 ENCOUNTER — Encounter: Payer: Self-pay | Admitting: *Deleted

## 2023-04-24 ENCOUNTER — Encounter: Payer: Self-pay | Admitting: Gastroenterology

## 2023-04-24 ENCOUNTER — Other Ambulatory Visit: Payer: Self-pay | Admitting: *Deleted

## 2023-04-24 ENCOUNTER — Ambulatory Visit (INDEPENDENT_AMBULATORY_CARE_PROVIDER_SITE_OTHER): Payer: 59 | Admitting: Gastroenterology

## 2023-04-24 VITALS — BP 122/80 | HR 81 | Temp 98.2°F | Ht 70.0 in | Wt 232.0 lb

## 2023-04-24 DIAGNOSIS — Z800 Family history of malignant neoplasm of digestive organs: Secondary | ICD-10-CM | POA: Diagnosis not present

## 2023-04-24 DIAGNOSIS — Z8719 Personal history of other diseases of the digestive system: Secondary | ICD-10-CM

## 2023-04-24 DIAGNOSIS — R195 Other fecal abnormalities: Secondary | ICD-10-CM | POA: Diagnosis not present

## 2023-04-24 DIAGNOSIS — K649 Unspecified hemorrhoids: Secondary | ICD-10-CM

## 2023-04-24 MED ORDER — PEG 3350-KCL-NA BICARB-NACL 420 G PO SOLR: 4000.0000 mL | Freq: Once | ORAL | 0 refills | Status: AC

## 2023-04-24 NOTE — Patient Instructions (Signed)
 We will get you scheduled for a colonoscopy in the near future with Dr. Shaaron.  As we discussed, meloxicam works well for arthritis type pain but can cause inflammation/ulceration in your GI tract.  I would recommend that you limit meloxicam is much as possible.  If you are requiring meloxicam every day or even several days a week, it may be worthwhile to start you on something to keep the acid suppressant in your stomach in order to protect you from developing ulcers.  You can discuss this with your PCP if you end up being on meloxicam long-term.  I will plan to see you back in the office as needed.  It was very nice to meet you today!  Josette Centers, PA-C [facility]

## 2023-05-05 DIAGNOSIS — Z79891 Long term (current) use of opiate analgesic: Secondary | ICD-10-CM | POA: Diagnosis not present

## 2023-05-05 DIAGNOSIS — M170 Bilateral primary osteoarthritis of knee: Secondary | ICD-10-CM | POA: Diagnosis not present

## 2023-05-05 DIAGNOSIS — M542 Cervicalgia: Secondary | ICD-10-CM | POA: Diagnosis not present

## 2023-05-05 DIAGNOSIS — G894 Chronic pain syndrome: Secondary | ICD-10-CM | POA: Diagnosis not present

## 2023-05-05 DIAGNOSIS — M25562 Pain in left knee: Secondary | ICD-10-CM | POA: Diagnosis not present

## 2023-05-05 DIAGNOSIS — M4322 Fusion of spine, cervical region: Secondary | ICD-10-CM | POA: Diagnosis not present

## 2023-05-05 DIAGNOSIS — M25561 Pain in right knee: Secondary | ICD-10-CM | POA: Diagnosis not present

## 2023-05-05 DIAGNOSIS — Z79899 Other long term (current) drug therapy: Secondary | ICD-10-CM | POA: Diagnosis not present

## 2023-05-15 ENCOUNTER — Ambulatory Visit (HOSPITAL_COMMUNITY)
Admission: RE | Admit: 2023-05-15 | Discharge: 2023-05-15 | Disposition: A | Discharge status: Home / Self Care | Payer: 59 | Attending: Internal Medicine | Admitting: Internal Medicine

## 2023-05-15 ENCOUNTER — Encounter (HOSPITAL_COMMUNITY)
Admission: RE | Disposition: A | Discharge status: Home / Self Care | Payer: Self-pay | Source: Home / Self Care | Attending: Internal Medicine

## 2023-05-15 ENCOUNTER — Other Ambulatory Visit: Payer: Self-pay

## 2023-05-15 ENCOUNTER — Ambulatory Visit (HOSPITAL_BASED_OUTPATIENT_CLINIC_OR_DEPARTMENT_OTHER): Payer: 59 | Admitting: Anesthesiology

## 2023-05-15 ENCOUNTER — Ambulatory Visit (HOSPITAL_COMMUNITY): Payer: 59 | Admitting: Anesthesiology

## 2023-05-15 ENCOUNTER — Encounter (HOSPITAL_COMMUNITY): Payer: Self-pay | Admitting: Internal Medicine

## 2023-05-15 DIAGNOSIS — Z800 Family history of malignant neoplasm of digestive organs: Secondary | ICD-10-CM | POA: Insufficient documentation

## 2023-05-15 DIAGNOSIS — D126 Benign neoplasm of colon, unspecified: Secondary | ICD-10-CM

## 2023-05-15 DIAGNOSIS — F17210 Nicotine dependence, cigarettes, uncomplicated: Secondary | ICD-10-CM | POA: Insufficient documentation

## 2023-05-15 DIAGNOSIS — K5730 Diverticulosis of large intestine without perforation or abscess without bleeding: Secondary | ICD-10-CM | POA: Diagnosis not present

## 2023-05-15 DIAGNOSIS — M1990 Unspecified osteoarthritis, unspecified site: Secondary | ICD-10-CM | POA: Insufficient documentation

## 2023-05-15 DIAGNOSIS — Z791 Long term (current) use of non-steroidal anti-inflammatories (NSAID): Secondary | ICD-10-CM | POA: Diagnosis not present

## 2023-05-15 DIAGNOSIS — R195 Other fecal abnormalities: Secondary | ICD-10-CM | POA: Insufficient documentation

## 2023-05-15 DIAGNOSIS — Z1211 Encounter for screening for malignant neoplasm of colon: Secondary | ICD-10-CM | POA: Insufficient documentation

## 2023-05-15 DIAGNOSIS — I10 Essential (primary) hypertension: Secondary | ICD-10-CM | POA: Diagnosis not present

## 2023-05-15 HISTORY — PX: COLONOSCOPY WITH PROPOFOL: SHX5780

## 2023-05-15 SURGERY — COLONOSCOPY WITH PROPOFOL
Anesthesia: General

## 2023-05-15 MED ORDER — PROPOFOL 500 MG/50ML IV EMUL
INTRAVENOUS | Status: DC | PRN
  Administered 2023-05-15: 150 ug/kg/min via INTRAVENOUS

## 2023-05-15 MED ORDER — LACTATED RINGERS IV SOLN: INTRAVENOUS | Status: DC

## 2023-05-15 MED ORDER — LIDOCAINE HCL (CARDIAC) PF 100 MG/5ML IV SOSY
PREFILLED_SYRINGE | INTRAVENOUS | Status: DC | PRN
  Administered 2023-05-15: 50 mg via INTRATRACHEAL

## 2023-05-15 MED ORDER — PROPOFOL 10 MG/ML IV BOLUS
INTRAVENOUS | Status: DC | PRN
  Administered 2023-05-15: 100 mg via INTRAVENOUS
  Administered 2023-05-15: 50 mg via INTRAVENOUS

## 2023-05-15 NOTE — Discharge Instructions (Addendum)
  Colonoscopy Discharge Instructions  Read the instructions outlined below and refer to this sheet in the next few weeks. These discharge instructions provide you with general information on caring for yourself after you leave the hospital. Your doctor may also give you specific instructions. While your treatment has been planned according to the most current medical practices available, unavoidable complications occasionally occur. If you have any problems or questions after discharge, call Dr. Jena Gauss at [phone]. ACTIVITY You may resume your regular activity, but move at a slower pace for the next 24 hours.  Take frequent rest periods for the next 24 hours.  Walking will help get rid of the air and reduce the bloated feeling in your belly (abdomen).  No driving for 24 hours (because of the medicine (anesthesia) used during the test).   Do not sign any important legal documents or operate any machinery for 24 hours (because of the anesthesia used during the test).  NUTRITION Drink plenty of fluids.  You may resume your normal diet as instructed by your doctor.  Begin with a light meal and progress to your normal diet. Heavy or fried foods are harder to digest and may make you feel sick to your stomach (nauseated).  Avoid alcoholic beverages for 24 hours or as instructed.  MEDICATIONS You may resume your normal medications unless your doctor tells you otherwise.  WHAT YOU CAN EXPECT TODAY Some feelings of bloating in the abdomen.  Passage of more gas than usual.  Spotting of blood in your stool or on the toilet paper.  IF YOU HAD POLYPS REMOVED DURING THE COLONOSCOPY: No aspirin products for 7 days or as instructed.  No alcohol for 7 days or as instructed.  Eat a soft diet for the next 24 hours.  FINDING OUT THE RESULTS OF YOUR TEST Not all test results are available during your visit. If your test results are not back during the visit, make an appointment with your caregiver to find out the  results. Do not assume everything is normal if you have not heard from your caregiver or the medical facility. It is important for you to follow up on all of your test results.  SEEK IMMEDIATE MEDICAL ATTENTION IF: You have more than a spotting of blood in your stool.  Your belly is swollen (abdominal distention).  You are nauseated or vomiting.  You have a temperature over 101.  You have abdominal pain or discomfort that is severe or gets worse throughout the day.       your colonoscopy prep was poor.  It was inadequate.  Could not complete your colonoscopy.  I did see polyps in your colon at least 1 was pretty good size.  Unsafe to remove if prep is poor   my office will be in touch to get you rescheduled.  You will have to have a better prep.

## 2023-05-15 NOTE — Op Note (Signed)
[facility] Patient Name: Mark Preston Procedure Date: 05/15/2023 9:29 AM MRN: [id number] Date of Birth: Mar 12, 1971 Attending MD: Gennette Pac , MD, [id number] CSN: [id number] Age: 53 Admit Type: Outpatient Procedure:                Colonoscopy Indications:              Positive fecal immunochemical test Providers:                Gennette Pac, MD, Crystal Page, Lennice Sites                            Technician, Technician Referring MD:              Medicines:                Propofol per Anesthesia Complications:            No immediate complications. Estimated Blood Loss:     Estimated blood loss: none. Procedure:                Pre-Anesthesia Assessment:                           - Prior to the procedure, a History and Physical                            was performed, and patient medications and                            allergies were reviewed. The patient's tolerance of                            previous anesthesia was also reviewed. The risks                            and benefits of the procedure and the sedation                            options and risks were discussed with the patient.                            All questions were answered, and informed consent                            was obtained. Prior Anticoagulants: The patient has                            taken no anticoagulant or antiplatelet agents. ASA                            Grade Assessment: II - A patient with mild systemic                            disease. After reviewing the risks and benefits,  the patient was deemed in satisfactory condition to                            undergo the procedure.                           After obtaining informed consent, the colonoscope                            was passed under direct vision. Throughout the                            procedure, the patient's blood pressure, pulse, and                             oxygen saturations were monitored continuously. The                            [id number]) scope was introduced through the                            anus and advanced to the the transverse colon. The                            colonoscopy was performed without difficulty. The                            patient tolerated the procedure well. The quality                            of the bowel preparation was inadequate. Scope In: 9:48:29 AM Scope Out: 9:54:36 AM Total Procedure Duration: 0 hours 6 minutes 7 seconds  Findings:      The perianal and digital rectal examinations were normal. Endoscopic       findings Suprep was marginal to poor in the rectum and sigmoid segment       he had sigmoid diverticula in the proximal descending segment and he       appeared to have a 1.5 to 2 cm pedunculated polyp a couple of smaller       polyps seen along the way. Chunks of semiformed and formed stool       vegetable matter precluded performing a complete colonoscopy. Unsafe to       attempt any polypectomy today. Impression:               - Preparation of the colon was inadequate. Polyps                            seen but not removed.                           - No specimens collected. Moderate Sedation:      Moderate (conscious) sedation was personally administered by an       anesthesia professional. The following parameters were monitored: oxygen       saturation, heart rate, blood pressure, respiratory rate, EKG, adequacy  of pulmonary ventilation, and response to care. Recommendation:           - Patient has a contact number available for                            emergencies. The signs and symptoms of potential                            delayed complications were discussed with the                            patient. Return to normal activities tomorrow.                            Written discharge instructions were provided to the                            patient.                            - Advance diet as tolerated.                           - Repeat colonoscopy at the next available                            appointment because the bowel preparation was                            suboptimal.                           - Return to GI office (date not yet determined). Procedure Code(s):        --- Professional ---                           [phone], 53, Colonoscopy, flexible; diagnostic,                            including collection of specimen(s) by brushing or                            washing, when performed (separate procedure) Diagnosis Code(s):        --- Professional ---                           R19.5, Other fecal abnormalities CPT copyright 2022 American Medical Association. All rights reserved. The codes documented in this report are preliminary and upon coder review may  be revised to meet current compliance requirements. Gerrit Friends. Lakeith Careaga, MD Gennette Pac, MD 05/15/2023 10:05:15 AM This report has been signed electronically. Number of Addenda: 0

## 2023-05-15 NOTE — Anesthesia Preprocedure Evaluation (Signed)
Anesthesia Evaluation  Patient identified by MRN, date of birth, ID band Patient awake    Reviewed: Allergy & Precautions, H&P , NPO status , Patient's Chart, lab work & pertinent test results, reviewed documented beta blocker date and time   Airway Mallampati: II  TM Distance: >3 FB Neck ROM: full    Dental no notable dental hx.    Pulmonary neg pulmonary ROS, Current Smoker and Patient abstained from smoking.   Pulmonary exam normal breath sounds clear to auscultation       Cardiovascular Exercise Tolerance: Good hypertension, negative cardio ROS  Rhythm:regular Rate:Normal     Neuro/Psych negative neurological ROS  negative psych ROS   GI/Hepatic negative GI ROS, Neg liver ROS,,,  Endo/Other  negative endocrine ROS    Renal/GU negative Renal ROS  negative genitourinary   Musculoskeletal   Abdominal   Peds  Hematology negative hematology ROS (+)   Anesthesia Other Findings   Reproductive/Obstetrics negative OB ROS                             Anesthesia Physical Anesthesia Plan  ASA: 2  Anesthesia Plan: General   Post-op Pain Management:    Induction:   PONV Risk Score and Plan: Propofol infusion  Airway Management Planned:   Additional Equipment:   Intra-op Plan:   Post-operative Plan:   Informed Consent: I have reviewed the patients History and Physical, chart, labs and discussed the procedure including the risks, benefits and alternatives for the proposed anesthesia with the patient or authorized representative who has indicated his/her understanding and acceptance.     Dental Advisory Given  Plan Discussed with: CRNA  Anesthesia Plan Comments:        Anesthesia Quick Evaluation

## 2023-05-15 NOTE — Transfer of Care (Signed)
Immediate Anesthesia Transfer of Care Note  Patient: Mark Preston  Procedure(s) Performed: COLONOSCOPY WITH PROPOFOL  Patient Location: Endoscopy Unit  Anesthesia Type:General  Level of Consciousness: awake  Airway & Oxygen Therapy: Patient Spontanous Breathing  Post-op Assessment: Report given to RN  Post vital signs: Reviewed  Last Vitals:  Vitals Value Taken Time  BP    Temp    Pulse    Resp    SpO2      Last Pain:  Vitals:   05/15/23 0942  TempSrc:   PainSc: 0-No pain      Patients Stated Pain Goal: 6 (05/15/23 0817)  Complications: No notable events documented.

## 2023-05-15 NOTE — Anesthesia Postprocedure Evaluation (Signed)
Anesthesia Post Note  Patient: AGUSTINE GORTNEY  Procedure(s) Performed: COLONOSCOPY WITH PROPOFOL  Patient location during evaluation: Endoscopy Anesthesia Type: General Level of consciousness: awake and alert Pain management: pain level controlled Vital Signs Assessment: post-procedure vital signs reviewed and stable Respiratory status: spontaneous breathing Cardiovascular status: blood pressure returned to baseline and stable Postop Assessment: no apparent nausea or vomiting Anesthetic complications: no   No notable events documented.   Last Vitals:  Vitals:   05/15/23 0817  BP: 116/80  Pulse: 65  Resp: 11  Temp: 36.8 C  SpO2: 99%    Last Pain:  Vitals:   05/15/23 0942  TempSrc:   PainSc: 0-No pain                 Laverne Hursey

## 2023-05-15 NOTE — Progress Notes (Signed)
Procedure complete and pt ready for discharge.  Arrived by his friend, Simona Huh, who drives [employer].  Called [employer] who states it will be several hours before he can come get pt as he is "making a run." Pt presently attempting to reach family who can pick him up.

## 2023-05-15 NOTE — Interval H&P Note (Signed)
History and Physical Interval Note:  05/15/2023 9:35 AM  Mark Preston  has presented today for surgery, with the diagnosis of positive FIT.  The various methods of treatment have been discussed with the patient and family. After consideration of risks, benefits and other options for treatment, the patient has consented to  Procedure(s) with comments: COLONOSCOPY WITH PROPOFOL (N/A) - 10:00 am, asa 2 as a surgical intervention.  The patient's history has been reviewed, patient examined, no change in status, stable for surgery.  I have reviewed the patient's chart and labs.  Questions were answered to the patient's satisfaction.     Mark Preston   no change.  Paper hematochezia.  Diagnostic colonoscopy today per plan.  The risks, benefits, limitations, alternatives and imponderables have been reviewed with the patient. Questions have been answered. All parties are agreeable.

## 2023-05-16 ENCOUNTER — Encounter (HOSPITAL_COMMUNITY): Payer: Self-pay | Admitting: Internal Medicine

## 2023-05-20 NOTE — Progress Notes (Deleted)
 Referring Provider: Wilmon Pali, FNP Primary Care Physician:  Wilmon Pali, FNP Primary GI Physician: Dr. Bonnetta Barry chief complaint on file.   HPI:   Mark Preston is a 53 y.o. male presenting today to discuss rescheduling colonoscopy due to poor prep.   Patient was last seen in the office 04/24/2023 for positive FIT completed 04/01/2023.  Also reported chronic history of intermittent low-volume rectal bleeding in the setting of prolapsing hemorrhoids occurring a couple times a year.  He was scheduled for first-ever colonoscopy.  Colonoscopy 05/15/2023. He completed Trilyte bowel prep*** and was found to have marginal to poor prep in the rectum, sigmoid segment.  He was seen to have a fairly large pedunculated polyp ranging 1.5 to 2 cm and a couple of smaller polyps were not removed due to poor prep.  Recommended repeating colonoscopy at next available appointment due to poor bowel prep.   Today    Past Medical History:  Diagnosis Date   Arthritis    Degenerative disc disease, cervical     Past Surgical History:  Procedure Laterality Date   CERVICAL SPINE SURGERY     COLONOSCOPY WITH PROPOFOL N/A 05/15/2023   Procedure: COLONOSCOPY WITH PROPOFOL;  Surgeon: Corbin Ade, MD;  Location: [facility];  Service: Endoscopy;  Laterality: N/A;  10:00 am, asa 2    Current Outpatient Medications  Medication Sig Dispense Refill   gabapentin (NEURONTIN) 300 MG capsule Take 300 mg by mouth 3 (three) times daily.     meloxicam (MOBIC) 7.5 MG tablet Take 7.5 mg by mouth 2 (two) times daily.     No current facility-administered medications for this visit.    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 Denies fatigue, weakness, weight loss.  CV: Denies chest pain, palpitations, syncope, peripheral edema, and claudication. Resp: Denies dyspnea at rest, cough, wheezing, coughing up blood, and pleurisy. GI: Denies vomiting blood, jaundice, and fecal incontinence.   Denies dysphagia or odynophagia. Derm: Denies rash, itching, dry skin Psych: Denies depression, anxiety, memory loss, confusion. No homicidal or suicidal ideation.  Heme: Denies bruising, bleeding, and enlarged lymph nodes.  Physical Exam: There were no vitals taken for this visit. General:   Alert and oriented. No distress noted. Pleasant and cooperative.  Head:  Normocephalic  and atraumatic. Eyes:  Conjuctiva clear without scleral icterus. Heart:  S1, S2 present without murmurs appreciated. Lungs:  Clear to auscultation bilaterally. No wheezes, rales, or rhonchi. No distress.  Abdomen:  +BS, soft, non-tender and non-distended. No rebound or guarding. No HSM or masses noted. Msk:  Symmetrical without gross deformities. Normal posture. Extremities:  Without edema. Neurologic:  Alert and  oriented x4 Psych:  Normal mood and affect.    Assessment:     Plan:  ***   Ermalinda Memos, PA-C [facility] 05/22/2023

## 2023-05-22 ENCOUNTER — Ambulatory Visit: Payer: 59 | Admitting: Gastroenterology

## 2023-06-03 ENCOUNTER — Ambulatory Visit (INDEPENDENT_AMBULATORY_CARE_PROVIDER_SITE_OTHER): Payer: Medicaid Other | Admitting: Internal Medicine

## 2023-06-03 ENCOUNTER — Encounter: Payer: Self-pay | Admitting: Internal Medicine

## 2023-06-03 ENCOUNTER — Other Ambulatory Visit: Payer: Self-pay | Admitting: *Deleted

## 2023-06-03 ENCOUNTER — Encounter: Payer: Self-pay | Admitting: *Deleted

## 2023-06-03 VITALS — BP 121/76 | HR 65 | Temp 98.6°F | Ht 70.0 in | Wt 225.8 lb

## 2023-06-03 DIAGNOSIS — R195 Other fecal abnormalities: Secondary | ICD-10-CM | POA: Diagnosis not present

## 2023-06-03 DIAGNOSIS — Z860100 Personal history of colon polyps, unspecified: Secondary | ICD-10-CM

## 2023-06-03 MED ORDER — PEG 3350-KCL-NA BICARB-NACL 420 G PO SOLR: 4000.0000 mL | ORAL | 0 refills | Status: AC

## 2023-06-03 NOTE — Progress Notes (Unsigned)
Primary Care Physician:  Wilmon Pali, FNP Primary Gastroenterologist:  Dr. Jena Gauss  Pre-Procedure History & Physical: HPI:  Mark Preston is a 53 y.o. male here for regrouping to repeat colonoscopy.  Patient underwent attempted colonoscopy last month because of a positive FIOBT.  He was found to have multiple polyps in the setting of a poor prep he had 1 large 2 cm pedunculated lesion.  However residual stool and gas in the colon precluded attempted room completing the examination or removing the polyps that were seen.  He returns today  Discussed with him the importance of a adequate preparation and the need for a thorough colonoscopy with polypectomy in the near future.  I showed him pictures of the polyps and the poor prep on the computer today when he was in the office.  Past Medical History:  Diagnosis Date   Arthritis    Degenerative disc disease, cervical     Past Surgical History:  Procedure Laterality Date   CERVICAL SPINE SURGERY     COLONOSCOPY WITH PROPOFOL N/A 05/15/2023   Procedure: COLONOSCOPY WITH PROPOFOL;  Surgeon: Corbin Ade, MD;  Location: [facility];  Service: Endoscopy;  Laterality: N/A;  10:00 am, asa 2    Prior to Admission medications   Medication Sig Start Date End Date Taking? Authorizing Provider  celecoxib (CELEBREX) 200 MG capsule 1 capsule with food Orally Once a day for 90 days 06/03/23  Yes [provider]  diclofenac Sodium (VOLTAREN) 1 % GEL 1-2 inches to affected joint Externally twice daily for 30 days 03/27/23  Yes [provider]  gabapentin (NEURONTIN) 300 MG capsule Take 300 mg by mouth 3 (three) times daily. 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)   Drug use: Yes    Types: Marijuana   Sexual activity: Not on file  Other Topics Concern   Not on file  Social History Narrative   Not on file   Social Drivers of Health   Financial Resource Strain: Not on file  Food Insecurity: Not on file  Transportation Needs: Not on file  Physical Activity: Not on file  Stress: Not on file  Social Connections: Not on file  Intimate Partner Violence: Not on file    Review of Systems: See HPI, otherwise negative ROS  Physical Exam: BP 121/76 (BP Location: Right Arm, Patient Position: Sitting, Cuff Size: Large)   Pulse 65   Temp 98.6 F (37 C) (Oral)   Ht 5\' 10"  (1.778 m)   Wt 225 lb 12.8 oz (102.4 kg)   SpO2 97%   BMI 32.40 kg/m  General:   Alert,  Well-developed, well-nourished, pleasant and cooperative in NAD Lungs:  Clear throughout to auscultation.   No wheezes, crackles, or rhonchi. No acute  distress. Heart:  Regular rate and rhythm; no murmurs, clicks, rubs,  or gallops. Abdomen: Non-distended, normal bowel sounds.  Soft and nontender without appreciable mass or hepatosplenomegaly.   Impression/Plan: 53 year old gentleman with blood in his stool and attempted colonoscopy revealing at least a couple of polyps-1 large in his left colon.  However completion of examination and polypectomy could not feasible due to an inadequate preparation.  I discussed the importance of a good prep so that a thorough high-quality colonoscopy can be carried out so that all polyps present can be removed.  I showed him photographs of the poor prep in his colon and the polyps that were seen.  The risk benefits limitations alternatives have been  reviewed with the patient he is fully agreeable with attempting another colonoscopy.  We will provide him with more of an aggressive preparation to ensure his colon is adequately cleaned out from the examination.  Further recommendations to follow.     Notice: This dictation was prepared with Dragon dictation along with smaller phrase technology. Any transcriptional errors that result from this process are unintentional and may not be corrected upon review.

## 2023-06-03 NOTE — Patient Instructions (Signed)
It was good to see you again today.  We will get your colon done and get those polyps out.  We will provide you with more of an aggressive colon preparation to ensure your colonoscopy is clean for the next colonoscopy  Is very important to have your colon cleaned out, follow prep instructions so that we can thoroughly examine your colon and take out all the polyps that are there.

## 2023-06-13 ENCOUNTER — Ambulatory Visit: Payer: 59 | Admitting: Gastroenterology

## 2023-07-03 ENCOUNTER — Ambulatory Visit (HOSPITAL_COMMUNITY)
Admission: RE | Admit: 2023-07-03 | Discharge: 2023-07-03 | Disposition: A | Discharge status: Home / Self Care | Payer: Medicaid Other | Attending: Internal Medicine | Admitting: Internal Medicine

## 2023-07-03 ENCOUNTER — Ambulatory Visit (HOSPITAL_COMMUNITY): Admitting: Anesthesiology

## 2023-07-03 ENCOUNTER — Other Ambulatory Visit: Payer: Self-pay

## 2023-07-03 ENCOUNTER — Encounter (HOSPITAL_COMMUNITY): Payer: Self-pay | Admitting: Internal Medicine

## 2023-07-03 ENCOUNTER — Encounter (HOSPITAL_COMMUNITY)
Admission: RE | Disposition: A | Discharge status: Home / Self Care | Payer: Self-pay | Source: Home / Self Care | Attending: Internal Medicine

## 2023-07-03 ENCOUNTER — Ambulatory Visit (HOSPITAL_BASED_OUTPATIENT_CLINIC_OR_DEPARTMENT_OTHER): Admitting: Anesthesiology

## 2023-07-03 ENCOUNTER — Telehealth: Payer: Self-pay | Admitting: *Deleted

## 2023-07-03 DIAGNOSIS — F17210 Nicotine dependence, cigarettes, uncomplicated: Secondary | ICD-10-CM | POA: Insufficient documentation

## 2023-07-03 DIAGNOSIS — D125 Benign neoplasm of sigmoid colon: Secondary | ICD-10-CM | POA: Diagnosis not present

## 2023-07-03 DIAGNOSIS — D127 Benign neoplasm of rectosigmoid junction: Secondary | ICD-10-CM | POA: Insufficient documentation

## 2023-07-03 DIAGNOSIS — D122 Benign neoplasm of ascending colon: Secondary | ICD-10-CM

## 2023-07-03 DIAGNOSIS — K635 Polyp of colon: Secondary | ICD-10-CM

## 2023-07-03 DIAGNOSIS — K5730 Diverticulosis of large intestine without perforation or abscess without bleeding: Secondary | ICD-10-CM

## 2023-07-03 DIAGNOSIS — R195 Other fecal abnormalities: Secondary | ICD-10-CM | POA: Diagnosis not present

## 2023-07-03 DIAGNOSIS — I10 Essential (primary) hypertension: Secondary | ICD-10-CM | POA: Insufficient documentation

## 2023-07-03 DIAGNOSIS — Z1211 Encounter for screening for malignant neoplasm of colon: Secondary | ICD-10-CM | POA: Insufficient documentation

## 2023-07-03 DIAGNOSIS — Z860100 Personal history of colon polyps, unspecified: Secondary | ICD-10-CM

## 2023-07-03 DIAGNOSIS — K621 Rectal polyp: Secondary | ICD-10-CM | POA: Diagnosis not present

## 2023-07-03 HISTORY — PX: POLYPECTOMY: SHX5525

## 2023-07-03 HISTORY — PX: COLONOSCOPY WITH PROPOFOL: SHX5780

## 2023-07-03 HISTORY — PX: HEMOSTASIS CLIP PLACEMENT: SHX6857

## 2023-07-03 SURGERY — COLONOSCOPY WITH PROPOFOL
Anesthesia: General

## 2023-07-03 MED ORDER — PROPOFOL 1000 MG/100ML IV EMUL
INTRAVENOUS | Status: AC
  Filled 2023-07-03: qty 100

## 2023-07-03 MED ORDER — PROPOFOL 10 MG/ML IV BOLUS
INTRAVENOUS | Status: DC | PRN
Start: 2023-07-03 — End: 2023-07-03
  Administered 2023-07-03: 80 mg via INTRAVENOUS
  Administered 2023-07-03: 40 mg via INTRAVENOUS

## 2023-07-03 MED ORDER — PROPOFOL 500 MG/50ML IV EMUL
INTRAVENOUS | Status: DC | PRN
  Administered 2023-07-03: 150 ug/kg/min via INTRAVENOUS

## 2023-07-03 MED ORDER — LIDOCAINE HCL (CARDIAC) PF 100 MG/5ML IV SOSY
PREFILLED_SYRINGE | INTRAVENOUS | Status: DC | PRN
  Administered 2023-07-03: 60 mg via INTRATRACHEAL

## 2023-07-03 MED ORDER — LACTATED RINGERS IV SOLN
INTRAVENOUS | Status: DC | PRN
Start: 2023-07-03 — End: 2023-07-03

## 2023-07-03 MED ORDER — LIDOCAINE HCL (PF) 2 % IJ SOLN
INTRAMUSCULAR | Status: AC
Start: 2023-07-03 — End: ?
  Filled 2023-07-03: qty 5

## 2023-07-03 MED ORDER — DEXMEDETOMIDINE HCL IN NACL 80 MCG/20ML IV SOLN
INTRAVENOUS | Status: AC
  Filled 2023-07-03: qty 20

## 2023-07-03 NOTE — Telephone Encounter (Signed)
-----   Message from Eula Listen sent at 07/03/2023  8:33 AM EDT -----  this guy's prep was still marginal today a little bit better.  I was able to get the job done.  He needs an even better prep next go around.  Please make a note.

## 2023-07-03 NOTE — Op Note (Signed)
 [facility] Patient Name: Mark Preston Procedure Date: 07/03/2023 6:57 AM MRN: [id number] Date of Birth: 21-Mar-1971 Attending MD: Gennette Pac , MD, [id number] CSN: [id number] Age: 53 Admit Type: Outpatient Procedure:                Colonoscopy Indications:              Positive fecal immunochemical test Providers:                Gennette Pac, MD, Crystal Page, Dyann Ruddle Referring MD:              Medicines:                Propofol per Anesthesia Complications:            No immediate complications. Estimated Blood Loss:     Estimated blood loss: none. Procedure:                Pre-Anesthesia Assessment:                           - Prior to the procedure, a History and Physical                            was performed, and patient medications and                            allergies were reviewed. The patient's tolerance of                            previous anesthesia was also reviewed. The risks                            and benefits of the procedure and the sedation                            options and risks were discussed with the patient.                            All questions were answered, and informed consent                            was obtained. Prior Anticoagulants: The patient has                            taken no anticoagulant or antiplatelet agents. ASA                            Grade Assessment: II - A patient with mild systemic                            disease. After reviewing the risks and benefits,                            the patient was deemed in satisfactory condition to  undergo the procedure.                           After obtaining informed consent, the colonoscope                            was passed under direct vision. Throughout the                            procedure, the patient's blood pressure, pulse, and                            oxygen saturations were monitored continuously. The                             [id number]) scope was introduced through the                            anus and advanced to the the cecum, identified by                            appendiceal orifice and ileocecal valve. The                            colonoscopy was performed with ease. The patient                            tolerated the procedure well. The quality of the                            bowel preparation was adequate. The colonoscopy was                            performed without difficulty. The patient tolerated                            the procedure well. The quality of the bowel                            preparation was adequate. The ileocecal valve,                            appendiceal orifice, and rectum were photographed. Scope In: 7:39:52 AM Scope Out: 8:17:31 AM Scope Withdrawal Time: 0 hours 12 minutes 41 seconds  Total Procedure Duration: 0 hours 37 minutes 39 seconds  Findings:      The perianal and digital rectal examinations were normal. Prep was       marginal. Again, some viscous liquid stool throughout the colon was able       to wash and lavage to create a marginally adequate preparation.      Scattered medium-mouthed diverticula were found in the ileocecal valve.      Three sessile polyps were found in the recto-sigmoid colon and ascending       colon. The polyps were 5 to 6 mm in size. These  polyps were removed with       a cold snare. Resection and retrieval were complete. Estimated blood       loss was minimal.      Two pedunculated polyps were found in the sigmoid colon and proximal       sigmoid colon. The polyps were 6 to 15 mm in size. These polyps were       removed with a hot snare. Resection and retrieval were complete.       Estimated blood loss: none.      The exam was otherwise without abnormality on direct and retroflexion       views. The largest of the 2 polypectomy sites was clipped x 1 with a       resolution clip Impression:                - Diverticulosis at the ileocecal valve.                           - Three 5 to 6 mm polyps at the recto-sigmoid colon                            and in the ascending colon, removed with a cold                            snare. Resected and retrieved.                           - Two 6 to 15 mm polyps in the sigmoid colon and in                            the proximal sigmoid colon, removed with a hot                            snare. Resected and retrieved. Clip x 1                           - The examination was otherwise normal on direct                            and retroflexion views. Moderate Sedation:      Moderate (conscious) sedation was personally administered by an       anesthesia professional. The following parameters were monitored: oxygen       saturation, heart rate, blood pressure, respiratory rate, EKG, adequacy       of pulmonary ventilation, and response to care. Recommendation:           - Patient has a contact number available for                            emergencies. The signs and symptoms of potential                            delayed complications were discussed with the  patient. Return to normal activities tomorrow.                            Written discharge instructions were provided to the                            patient.                           - Advance diet as tolerated.                           - Continue present medications.                           - Repeat colonoscopy date to be determined after                            pending pathology results are reviewed for                            surveillance.                           - Return to GI office (date not yet determined).                            Should undergo a double preparation for his next                            colonoscopy. Procedure Code(s):        --- Professional ---                           [phone], Colonoscopy, flexible; with removal  of                            tumor(s), polyp(s), or other lesion(s) by snare                            technique Diagnosis Code(s):        --- Professional ---                           D12.7, Benign neoplasm of rectosigmoid junction                           D12.2, Benign neoplasm of ascending colon                           D12.5, Benign neoplasm of sigmoid colon                           R19.5, Other fecal abnormalities                           K57.30, Diverticulosis of large intestine without  perforation or abscess without bleeding CPT copyright 2022 American [employer] Association. All rights reserved. The codes documented in this report are preliminary and upon coder review may  be revised to meet current compliance requirements. Gerrit Friends. Zehava Turski, MD Gennette Pac, MD 07/03/2023 8:33:00 AM This report has been signed electronically. Number of Addenda: 0

## 2023-07-03 NOTE — Discharge Instructions (Signed)
  Colonoscopy Discharge Instructions  Read the instructions outlined below and refer to this sheet in the next few weeks. These discharge instructions provide you with general information on caring for yourself after you leave the hospital. Your doctor may also give you specific instructions. While your treatment has been planned according to the most current medical practices available, unavoidable complications occasionally occur. If you have any problems or questions after discharge, call Dr. Jena Gauss at [phone]. ACTIVITY You may resume your regular activity, but move at a slower pace for the next 24 hours.  Take frequent rest periods for the next 24 hours.  Walking will help get rid of the air and reduce the bloated feeling in your belly (abdomen).  No driving for 24 hours (because of the medicine (anesthesia) used during the test).   Do not sign any important legal documents or operate any machinery for 24 hours (because of the anesthesia used during the test).  NUTRITION Drink plenty of fluids.  You may resume your normal diet as instructed by your doctor.  Begin with a light meal and progress to your normal diet. Heavy or fried foods are harder to digest and may make you feel sick to your stomach (nauseated).  Avoid alcoholic beverages for 24 hours or as instructed.  MEDICATIONS You may resume your normal medications unless your doctor tells you otherwise.  WHAT YOU CAN EXPECT TODAY Some feelings of bloating in the abdomen.  Passage of more gas than usual.  Spotting of blood in your stool or on the toilet paper.  IF YOU HAD POLYPS REMOVED DURING THE COLONOSCOPY: No aspirin products for 7 days or as instructed.  No alcohol for 7 days or as instructed.  Eat a soft diet for the next 24 hours.  FINDING OUT THE RESULTS OF YOUR TEST Not all test results are available during your visit. If your test results are not back during the visit, make an appointment with your caregiver to find out the  results. Do not assume everything is normal if you have not heard from your caregiver or the medical facility. It is important for you to follow up on all of your test results.  SEEK IMMEDIATE MEDICAL ATTENTION IF: You have more than a spotting of blood in your stool.  Your belly is swollen (abdominal distention).  You are nauseated or vomiting.  You have a temperature over 101.  You have abdominal pain or discomfort that is severe or gets worse throughout the day.    Your colonoscopy prep today was better than last time but not great.  I was able to complete your colon.  Multiple colon polyps removed   colon polyp and diverticulosis information provided   further recommendations to follow pending review of pathology report   at patient request, I called Mark Preston at [phone] -  reviewed findings and recommendations

## 2023-07-03 NOTE — Transfer of Care (Addendum)
 Immediate Anesthesia Transfer of Care Note  Patient: Mark Preston  Procedure(s) Performed: COLONOSCOPY WITH PROPOFOL POLYPECTOMY CONTROL OF HEMORRHAGE, GI TRACT, ENDOSCOPIC, BY CLIPPING OR OVERSEWING  Patient Location: Endoscopy Unit  Anesthesia Type:General  Level of Consciousness: awake, drowsy, and patient cooperative  Airway & Oxygen Therapy: Patient Spontanous Breathing  Post-op Assessment: Report given to RN and Post -op Vital signs reviewed and stable  Post vital signs: Reviewed and stable  Last Vitals:  Vitals Value Taken Time  BP 96/83 07/03/23   0824  Temp 36.6 07/03/23   0824  Pulse 76 07/03/23   0824  Resp 18 07/03/23   0824  SpO2 96% 07/03/23   0824    Last Pain:  Vitals:   07/03/23 0735  TempSrc:   PainSc: 0-No pain      Patients Stated Pain Goal: 8 (07/03/23 0701)  Complications: Oxygen saturations 94% initially in Endoscopy recovery room. Patient coughing, congested. CRNA concerned for risk of aspiration. Dr. Johnnette Litter examined patient postoperatively. Per Dr. Johnnette Litter, breathing unlabored. Oxygen saturations improving.

## 2023-07-03 NOTE — Anesthesia Preprocedure Evaluation (Signed)
Anesthesia Evaluation  Patient identified by MRN, date of birth, ID band Patient awake    Reviewed: Allergy & Precautions, H&P , NPO status , Patient's Chart, lab work & pertinent test results, reviewed documented beta blocker date and time   Airway Mallampati: II  TM Distance: >3 FB Neck ROM: full    Dental no notable dental hx.    Pulmonary neg pulmonary ROS, Current Smoker and Patient abstained from smoking.   Pulmonary exam normal breath sounds clear to auscultation       Cardiovascular Exercise Tolerance: Good hypertension, negative cardio ROS  Rhythm:regular Rate:Normal     Neuro/Psych negative neurological ROS  negative psych ROS   GI/Hepatic negative GI ROS, Neg liver ROS,,,  Endo/Other  negative endocrine ROS    Renal/GU negative Renal ROS  negative genitourinary   Musculoskeletal   Abdominal   Peds  Hematology negative hematology ROS (+)   Anesthesia Other Findings   Reproductive/Obstetrics negative OB ROS                             Anesthesia Physical Anesthesia Plan  ASA: 2  Anesthesia Plan: General   Post-op Pain Management:    Induction:   PONV Risk Score and Plan: Propofol infusion  Airway Management Planned:   Additional Equipment:   Intra-op Plan:   Post-operative Plan:   Informed Consent: I have reviewed the patients History and Physical, chart, labs and discussed the procedure including the risks, benefits and alternatives for the proposed anesthesia with the patient or authorized representative who has indicated his/her understanding and acceptance.     Dental Advisory Given  Plan Discussed with: CRNA  Anesthesia Plan Comments:        Anesthesia Quick Evaluation

## 2023-07-03 NOTE — Progress Notes (Signed)
 Dr.Kiel in to assess patient in post operative area. No new orders at this time. Stated ok to proceed with discharge.

## 2023-07-03 NOTE — H&P (Signed)
 @LOGO @   Primary Care Physician:  Wilmon Pali, FNP Primary Gastroenterologist:  Dr. Jena Gauss  Pre-Procedure History & Physical: HPI:  Mark Preston is a 53 y.o. male here for for diagnostic colonoscopy.  Referred for positive occult blood test previously incomplete colonoscopy due to poor prep however in the left colon a large at least 1 large polyp was identified.  He is here to complete colonoscopy and polypectomy as appropriate.  Past Medical History:  Diagnosis Date   Arthritis    Degenerative disc disease, cervical     Past Surgical History:  Procedure Laterality Date   CERVICAL SPINE SURGERY     COLONOSCOPY WITH PROPOFOL N/A 05/15/2023   Procedure: COLONOSCOPY WITH PROPOFOL;  Surgeon: Corbin Ade, MD;  Location: [facility];  Service: Endoscopy;  Laterality: N/A;  10:00 am, asa 2    Prior to Admission medications   Medication Sig Start Date End Date Taking? Authorizing Provider  celecoxib (CELEBREX) 200 MG capsule 1 capsule with food Orally Once a day for 90 days 06/03/23  Yes [provider]  gabapentin (NEURONTIN) 300 MG capsule Take 300 mg by mouth 3 (three) times daily. 04/03/23  Yes [provider]  polyethylene glycol-electrolytes (NULYTELY) 420 g solution Take 4,000 mLs by mouth as directed. 06/03/23  Yes Debraann Livingstone, Gerrit Friends, MD  diclofenac Sodium (VOLTAREN) 1 % GEL 1-2 inches to affected joint Externally twice daily for 30 days 03/27/23   [provider]    Allergies as of 06/03/2023   (No Known Allergies)    Family History  Problem Relation Age of Onset   Colon cancer Cousin     Social History   Socioeconomic History   Marital status: Single    Spouse name: Not on file   Number of children: Not on file   Years of education: Not on file   Highest education level: Not on file  Occupational History   Not on file  Tobacco Use   Smoking status: Every Day    Types: Cigarettes   Smokeless tobacco: Not on file  Substance and  Sexual Activity   Alcohol use: Not Currently    Comment: 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)   Drug use: Yes    Types: Marijuana   Sexual activity: Not on file  Other Topics Concern   Not on file  Social History Narrative   Not on file   Social Drivers of Health   Financial Resource Strain: Not on file  Food Insecurity: Not on file  Transportation Needs: Not on file  Physical Activity: Not on file  Stress: Not on file  Social Connections: Not on file  Intimate Partner Violence: Not on file    Review of Systems: See HPI, otherwise negative ROS  Physical Exam: BP 122/85   Pulse 62   Temp 97.8 F (36.6 C) (Oral)   Resp 15   Ht 5\' 10"  (1.778 m)   Wt 106.6 kg   SpO2 100%   BMI 33.72 kg/m  General:   Alert,  Well-developed, well-nourished, pleasant and cooperative in NAD Neck:  Supple; no masses or thyromegaly. No significant cervical adenopathy. Lungs:  Clear throughout to auscultation.   No wheezes, crackles, or rhonchi. No acute distress. Heart:  Regular rate and rhythm; no murmurs, clicks, rubs,  or gallops. Abdomen: Non-distended, normal bowel sounds.  Soft and nontender without appreciable mass or hepatosplenomegaly.   Impression/Plan: History of polyps.  Here for diagnostic colonoscopy. The risks, benefits, limitations, alternatives and  imponderables have been reviewed with the patient. Questions have been answered. All parties are agreeable.       Notice: This dictation was prepared with Dragon dictation along with smaller phrase technology. Any transcriptional errors that result from this process are unintentional and may not be corrected upon review.

## 2023-07-04 ENCOUNTER — Encounter (HOSPITAL_COMMUNITY): Payer: Self-pay | Admitting: Internal Medicine

## 2023-07-04 LAB — SURGICAL PATHOLOGY

## 2023-07-04 NOTE — Anesthesia Postprocedure Evaluation (Signed)
 Anesthesia Post Note  Patient: Mark Preston  Procedure(s) Performed: COLONOSCOPY WITH PROPOFOL POLYPECTOMY CONTROL OF HEMORRHAGE, GI TRACT, ENDOSCOPIC, BY CLIPPING OR OVERSEWING  Patient location during evaluation: Phase II Anesthesia Type: General Level of consciousness: awake Pain management: pain level controlled Vital Signs Assessment: post-procedure vital signs reviewed and stable Respiratory status: spontaneous breathing and respiratory function stable Cardiovascular status: blood pressure returned to baseline and stable Postop Assessment: no headache and no apparent nausea or vomiting Anesthetic complications: no Comments: Late entry   No notable events documented.   Last Vitals:  Vitals:   07/03/23 0824 07/03/23 0834  BP: 96/83 118/77  Pulse: 76 71  Resp: 18 20  Temp: 36.6 C   SpO2: 96% 97%    Last Pain:  Vitals:   07/03/23 0824  TempSrc: Oral  PainSc: 0-No pain                 Windell Norfolk

## 2023-07-06 ENCOUNTER — Encounter: Payer: Self-pay | Admitting: Internal Medicine

## 2023-09-03 ENCOUNTER — Encounter: Payer: Self-pay | Admitting: Orthopedic Surgery

## 2023-09-03 ENCOUNTER — Ambulatory Visit: Admitting: Orthopedic Surgery

## 2023-09-03 VITALS — BP 147/90 | HR 72 | Ht 70.0 in | Wt 231.0 lb

## 2023-09-03 DIAGNOSIS — M25562 Pain in left knee: Secondary | ICD-10-CM

## 2023-09-03 DIAGNOSIS — G8929 Other chronic pain: Secondary | ICD-10-CM

## 2023-09-03 NOTE — Progress Notes (Signed)
 Return Patient Visit  Assessment: Mark Preston is a 53 y.o. male with the following: 1. Chronic pain of left knee  Plan: ARMONIE CRISSINGER has recurrent pain in the left knee.  No recent injuries.  Pain is progressively worsening.  Prior injection improved his symptoms for at least a month.  He is willing to proceed with another injection.  I have advised him to stop the prednisone taper he was recently given.  Okay to continue with Celebrex, as well as gabapentin.  If he continues to have issues, we may have to consider obtaining an MRI.  He is aware that we will have to complete physical therapy in order to get authorization through his insurance company.   Procedure note injection Left knee joint   Verbal consent was obtained to inject the left knee joint  Timeout was completed to confirm the site of injection.  The skin was prepped with alcohol and ethyl chloride was sprayed at the injection site.  A 21-gauge needle was used to inject 40 mg of Depo-Medrol  and 1% lidocaine  (4 cc) into the left knee using an anterolateral approach.  There were no complications. A sterile bandage was applied.     Follow-up: Return if symptoms worsen or fail to improve.  Subjective:  Chief Complaint  Patient presents with   Knee Pain    L pt states injection only helped for 2 wks. Went to his PCP and was given Celebrex and gabapentin that has kept the pain down until approx 1 mo ago    History of Present Illness: Mark Preston is a 53 y.o. male who returns to clinic for repeat evaluation of left knee pain.  I saw him in clinic greater than 6 months ago.  At that time, injected the left knee.  He told me that the injection improved his symptoms for at least a month.  Since then, his pain is progressively worsened.  He states that is hard for him to work.  He has diffuse pain, including anterolateral, as well as the posterior knee.  He recently saw his primary care provider, and was given a short  prednisone Dosepak.  He started this yesterday.  He was also given Celebrex and gabapentin.  He has not worked with physical therapy.    Review of Systems: No fevers or chills No numbness or tingling No chest pain No shortness of breath No bowel or bladder dysfunction No GI distress No headaches    Objective: BP (!) 147/90   Pulse 72   Ht 5\' 10"  (1.778 m)   Wt 231 lb (104.8 kg)   BMI 33.15 kg/m   Physical Exam:  General: Alert and oriented. and No acute distress. Gait: Left sided antalgic gait.  Left knee without swelling.  No deformity.  Tenderness to palpation along the medial joint line.  No increased laxity varus or valgus stress.  Negative Lachman.  Tenderness to palpation within the popliteal fossa.  Crepitus with range of motion testing.    IMAGING: No new imaging obtained today   New Medications:  No orders of the defined types were placed in this encounter.     Tonita Frater, MD  09/03/2023 8:55 AM

## 2023-09-03 NOTE — Patient Instructions (Signed)

## 2023-10-15 ENCOUNTER — Ambulatory Visit: Admitting: Orthopedic Surgery

## 2023-10-21 ENCOUNTER — Ambulatory Visit: Admitting: Orthopedic Surgery

## 2023-10-21 DIAGNOSIS — B0223 Postherpetic polyneuropathy: Secondary | ICD-10-CM | POA: Insufficient documentation

## 2023-10-21 DIAGNOSIS — F17200 Nicotine dependence, unspecified, uncomplicated: Secondary | ICD-10-CM | POA: Insufficient documentation

## 2023-10-21 DIAGNOSIS — M179 Osteoarthritis of knee, unspecified: Secondary | ICD-10-CM | POA: Insufficient documentation

## 2023-10-21 DIAGNOSIS — E66811 Obesity, class 1: Secondary | ICD-10-CM | POA: Insufficient documentation

## 2023-12-02 ENCOUNTER — Ambulatory Visit: Admitting: Orthopedic Surgery

## 2023-12-10 ENCOUNTER — Ambulatory Visit: Admitting: Orthopaedic Surgery
